# Patient Record
Sex: Male | Born: 2000 | Race: White | Hispanic: No | Marital: Single | Smoking: Current some day smoker
Health system: Southern US, Community
[De-identification: ages and names within clinical notes are randomized; demographics above are authoritative.]

## PROBLEM LIST (undated history)

## (undated) DIAGNOSIS — J302 Other seasonal allergic rhinitis: Secondary | ICD-10-CM

## (undated) DIAGNOSIS — J45909 Unspecified asthma, uncomplicated: Secondary | ICD-10-CM

## (undated) DIAGNOSIS — B948 Sequelae of other specified infectious and parasitic diseases: Secondary | ICD-10-CM

## (undated) DIAGNOSIS — D8989 Other specified disorders involving the immune mechanism, not elsewhere classified: Secondary | ICD-10-CM

---
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
## 2018-07-02 ENCOUNTER — Other Ambulatory Visit: Payer: Self-pay | Admitting: Gastroenterology

## 2018-07-02 DIAGNOSIS — R1013 Epigastric pain: Secondary | ICD-10-CM

## 2018-07-04 ENCOUNTER — Other Ambulatory Visit
Admission: RE | Admit: 2018-07-04 | Discharge: 2018-07-04 | Disposition: A | Discharge status: Home / Self Care | Payer: BLUE CROSS/BLUE SHIELD | Source: Ambulatory Visit | Attending: Gastroenterology | Admitting: Gastroenterology

## 2018-07-04 ENCOUNTER — Ambulatory Visit
Admission: RE | Admit: 2018-07-04 | Discharge: 2018-07-04 | Disposition: A | Discharge status: Home / Self Care | Payer: BLUE CROSS/BLUE SHIELD | Source: Ambulatory Visit | Attending: Gastroenterology | Admitting: Gastroenterology

## 2018-07-04 DIAGNOSIS — R1013 Epigastric pain: Secondary | ICD-10-CM

## 2018-07-04 LAB — CBC WITH DIFFERENTIAL/PLATELET
Abs Immature Granulocytes: 0.01 10*3/uL (ref 0.00–0.07)
BASOS PCT: 1 %
Basophils Absolute: 0 10*3/uL (ref 0.0–0.1)
EOS ABS: 0.1 10*3/uL (ref 0.0–0.5)
Eosinophils Relative: 3 %
HCT: 38.5 % — ABNORMAL LOW (ref 39.0–52.0)
Hemoglobin: 13.5 g/dL (ref 13.0–17.0)
Immature Granulocytes: 0 %
Lymphocytes Relative: 24 %
Lymphs Abs: 1 10*3/uL (ref 0.7–4.0)
MCH: 31.2 pg (ref 26.0–34.0)
MCHC: 35.1 g/dL (ref 30.0–36.0)
MCV: 88.9 fL (ref 80.0–100.0)
MONO ABS: 0.4 10*3/uL (ref 0.1–1.0)
MONOS PCT: 10 %
NEUTROS PCT: 62 %
Neutro Abs: 2.8 10*3/uL (ref 1.7–7.7)
PLATELETS: 197 10*3/uL (ref 150–400)
RBC: 4.33 MIL/uL (ref 4.22–5.81)
RDW: 12.6 % (ref 11.5–15.5)
WBC: 4.4 10*3/uL (ref 4.0–10.5)
nRBC: 0 % (ref 0.0–0.2)

## 2018-07-04 LAB — BASIC METABOLIC PANEL
ANION GAP: 7 (ref 5–15)
BUN: 12 mg/dL (ref 6–20)
CALCIUM: 9.3 mg/dL (ref 8.9–10.3)
CHLORIDE: 106 mmol/L (ref 98–111)
CO2: 28 mmol/L (ref 22–32)
CREATININE: 0.78 mg/dL (ref 0.61–1.24)
GFR calc non Af Amer: >60 mL/min (ref >60)
Glucose, Bld: 97 mg/dL (ref 70–99)
Potassium: 4.1 mmol/L (ref 3.5–5.1)
SODIUM: 141 mmol/L (ref 135–145)

## 2018-07-04 LAB — PROTIME-INR
INR: 0.99
PROTHROMBIN TIME: 13 s (ref 11.4–15.2)

## 2019-03-15 IMAGING — CR DG CHEST 2V
1 series · 2 of 2 positions shown · non-contrast
Comparison: None available

CLINICAL DATA: Left chest pain

EXAM:
CHEST - 2 VIEW

[Series 1: dg chest 2 view · 0.14mm/px · 2 of 2 slices shown]
[im 1/2]
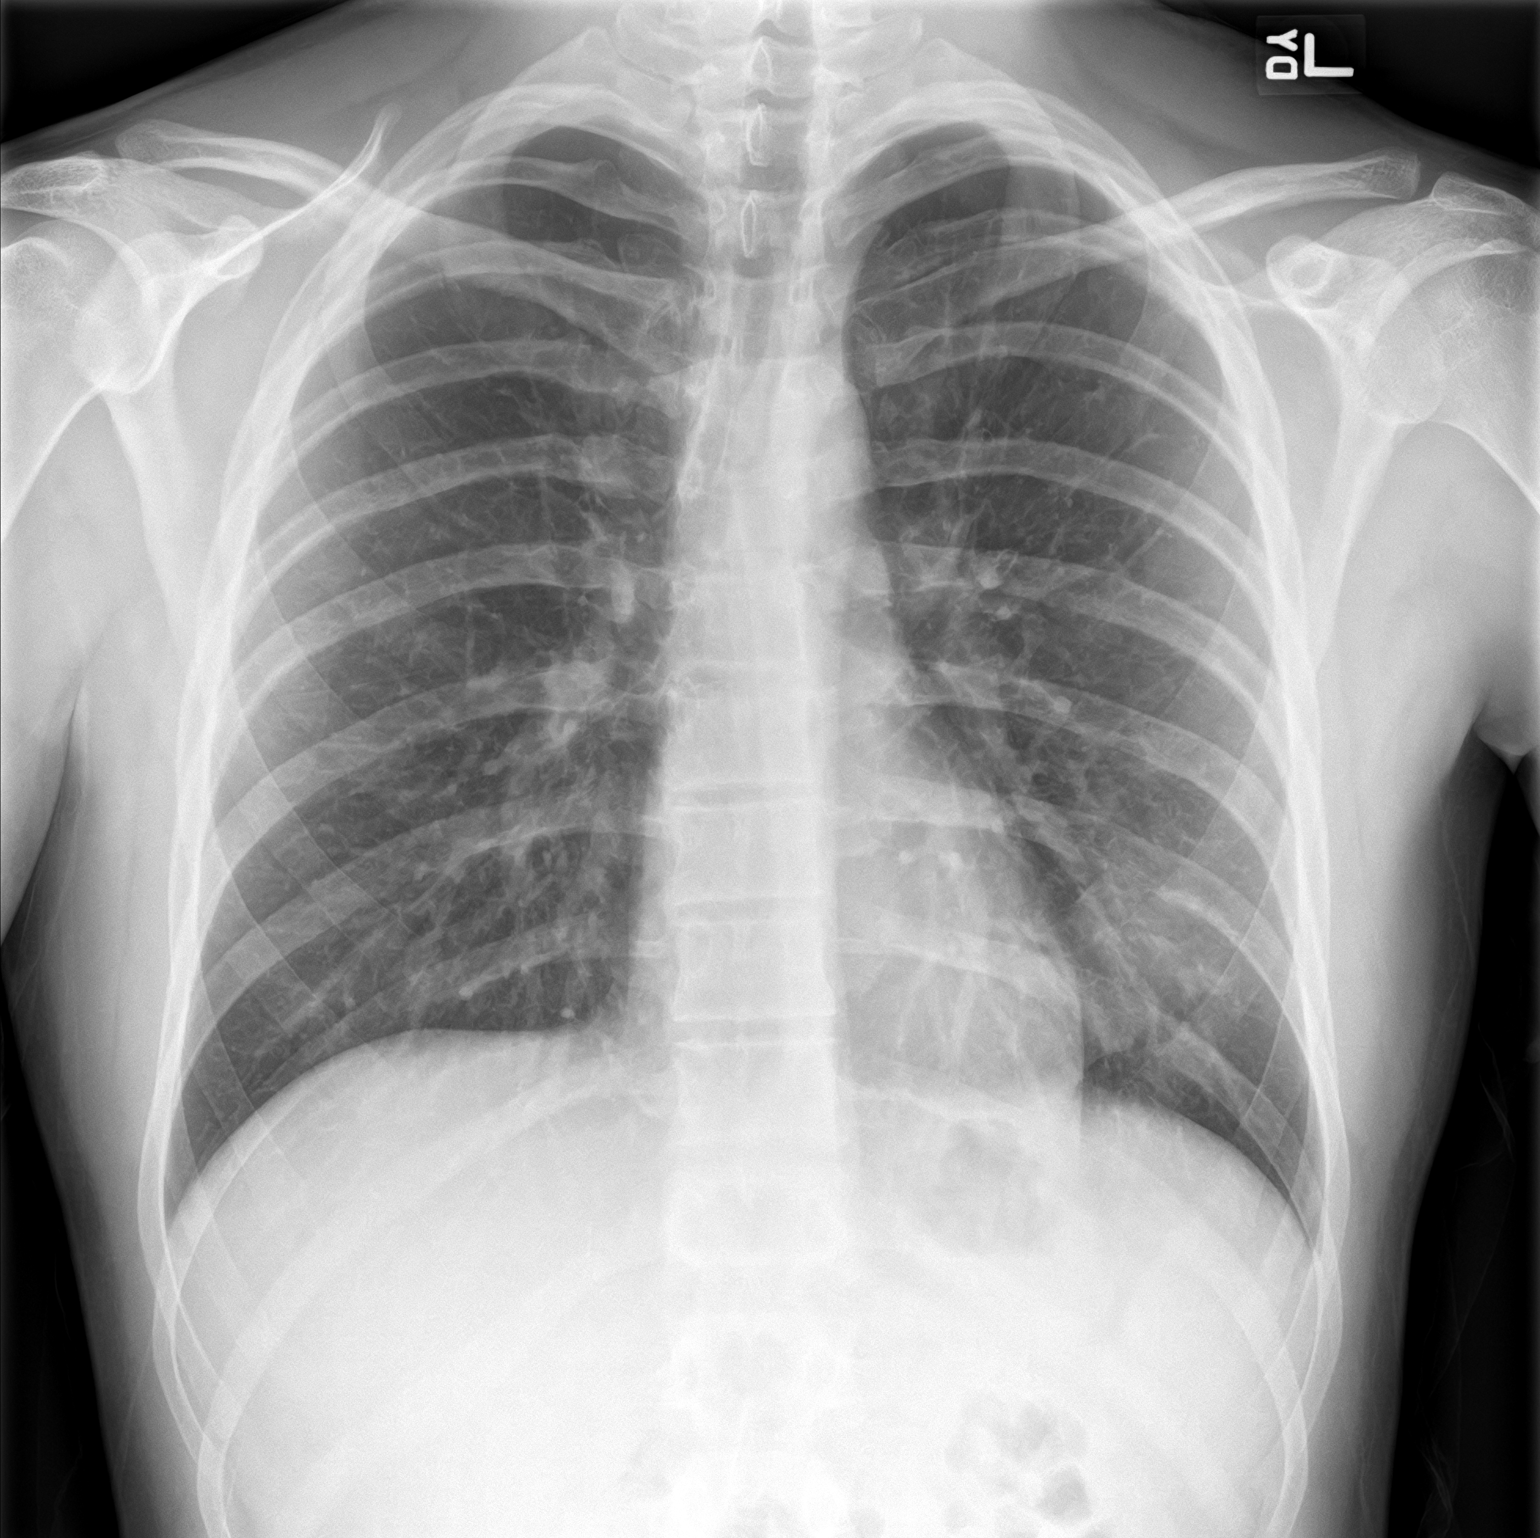
[im 2/2]
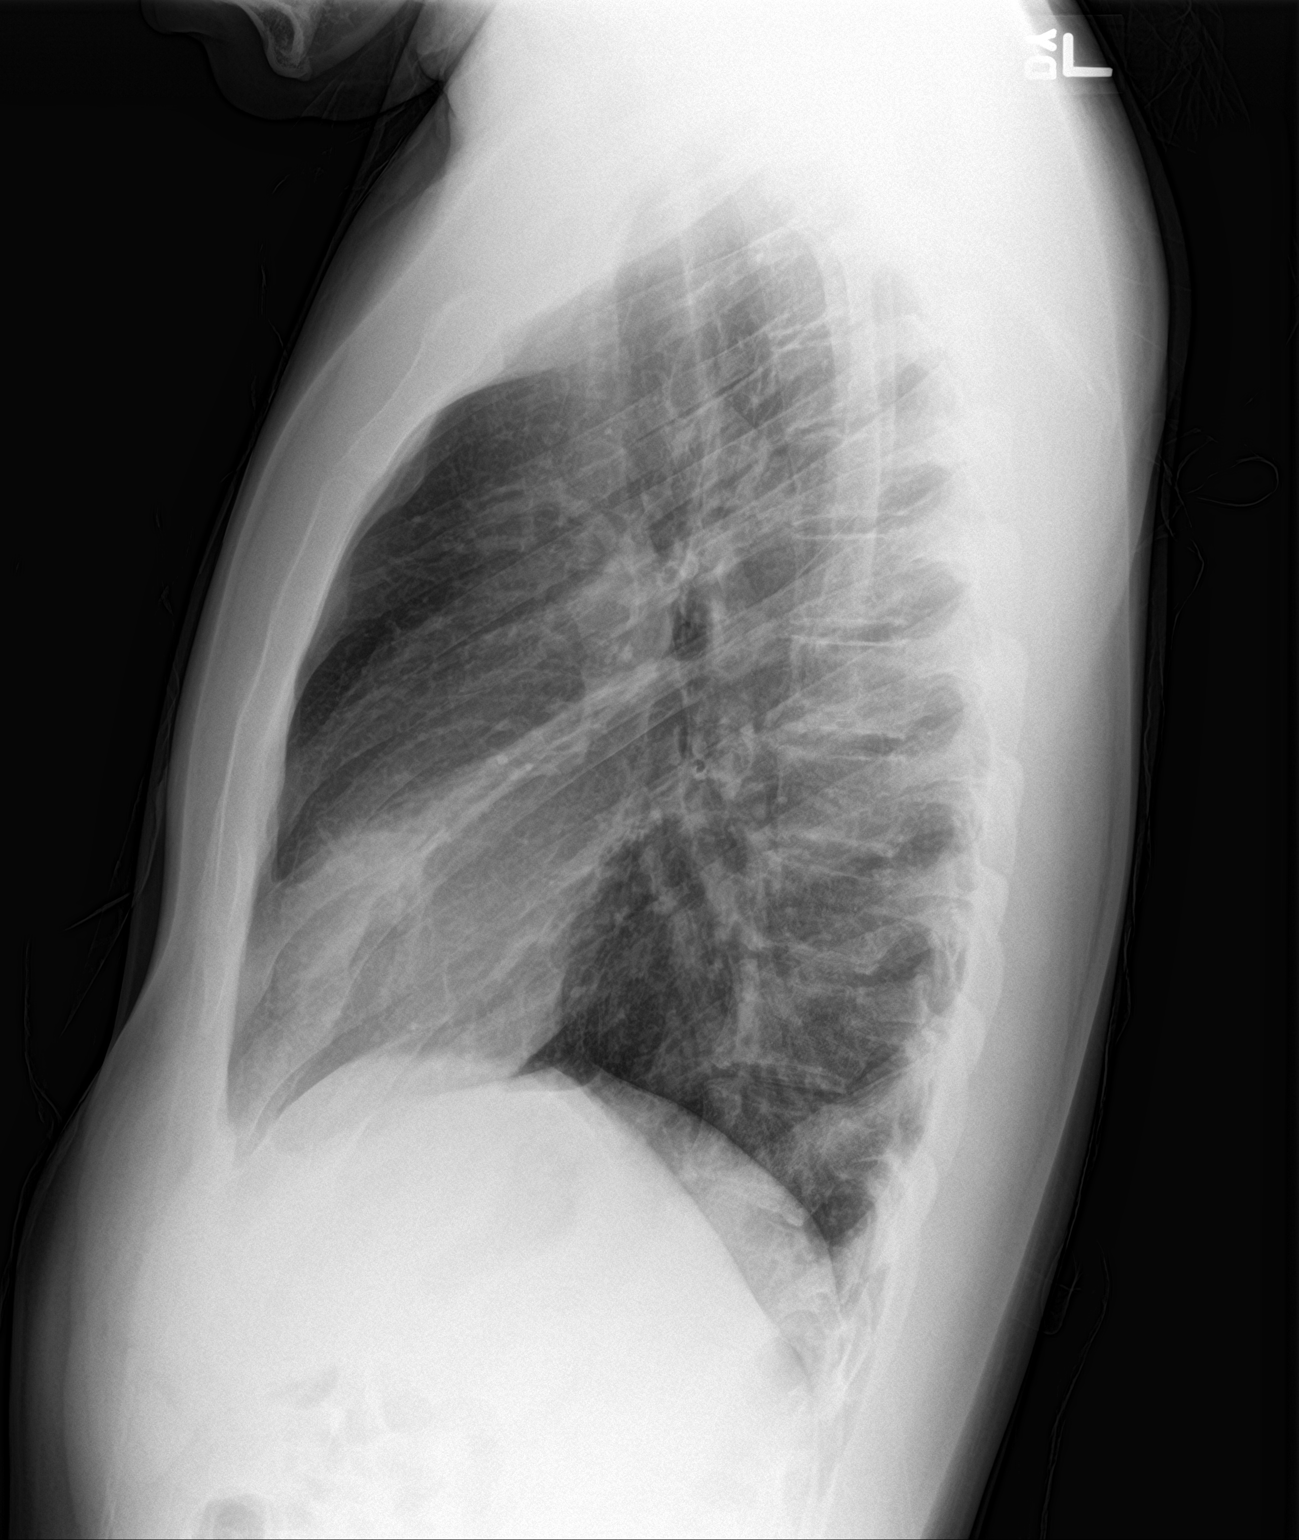

[2 of 2 positions shown; findings below may reference images not displayed]

FINDINGS: Ill-defined lingula opacity, best appreciated on the lateral view
suspicious for lingula pneumonia. Right lung remains clear. Normal
heart size and vascularity. Negative for edema, effusion or
pneumothorax. Trachea is midline. No osseous abnormality.
IMPRESSION: Suspect mild subtle lingula pneumonia.

## 2019-12-02 IMAGING — RF DG UGI W/ HIGH DENSITY W/KUB
11 of 13 series · 14 of 24 positions shown · non-contrast
Comparison: None.

CLINICAL DATA: Nausea and vomiting

EXAM:
UPPER GI SERIES WITH KUB
TECHNIQUE: After obtaining a scout radiograph a routine upper GI series was
performed using high density barium
FLUOROSCOPY TIME:  Fluoroscopy Time:  1 minutes 42 seconds
Radiation Exposure Index (if provided by the fluoroscopic device):
66.6 mGy
Number of Acquired Spot Images: 16, multiple cine fluoroscopic runs

[Series 1: t abdomen supine · 0.15mm/px · 1 of 1 slices shown]
[im 1/1]
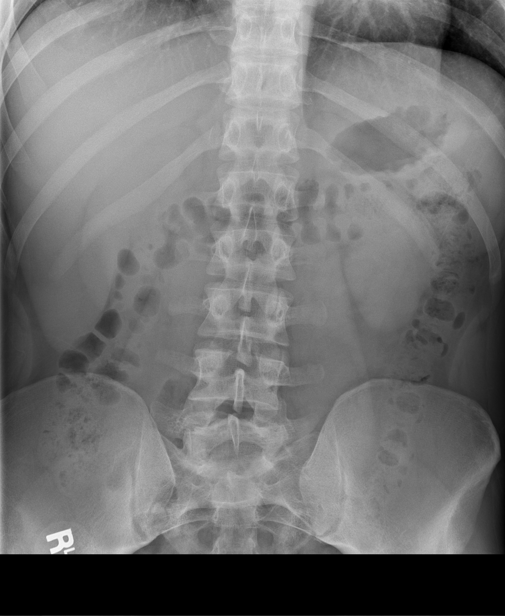

[Series 2: cp_standard · 0.17mm/px · 1 of 89 frames shown (1 of 5)]
[frame 45/89]
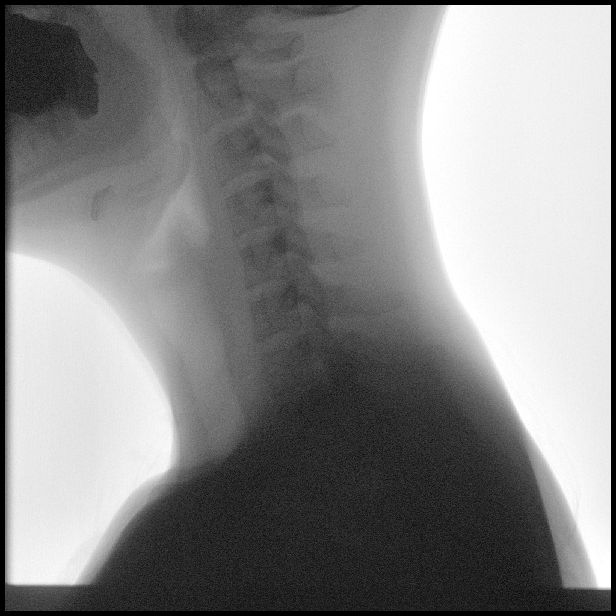

[Series 3: cp_standard · 0.17mm/px · 1 of 98 frames shown (2 of 5)]
[frame 15/98]
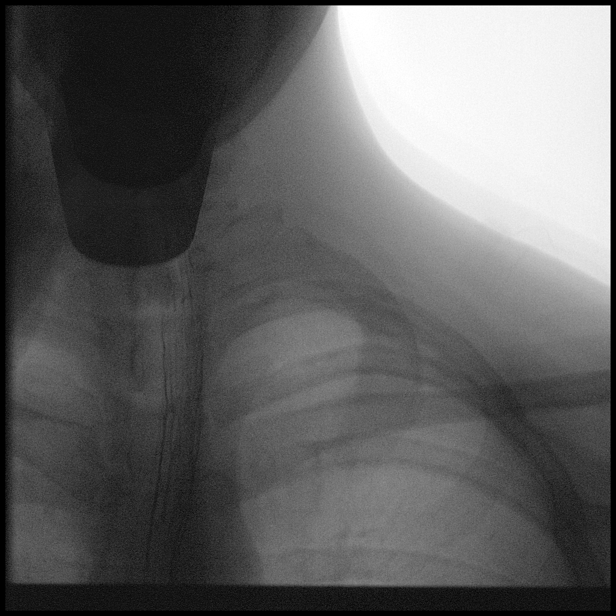

[Series 4: fluoro_barium 2fps_bw · 0.17mm/px · 2 of 2 frames shown (1 of 5)]
[frame 1/2]
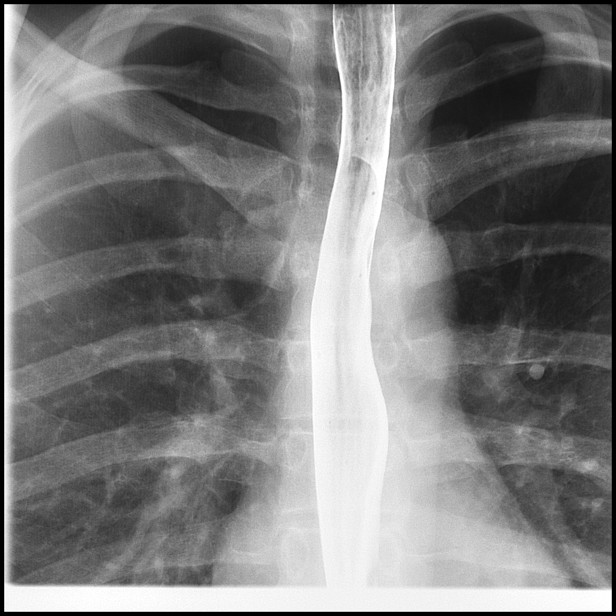
[frame 2/2]
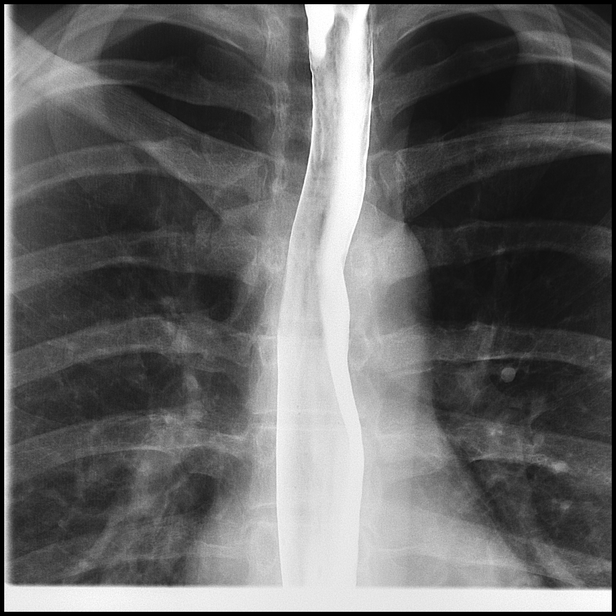

[Series 5: fluoro_barium 2fps_bw · 0.17mm/px · 1 of 3 frames shown (2 of 5)]
[frame 3/3]
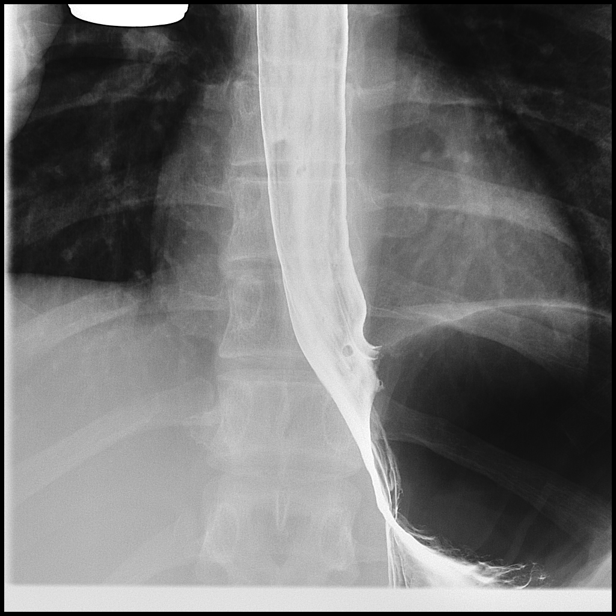

[Series 8: cp_standard · 0.18mm/px · 2 of 43 frames shown (3 of 5)]
[frame 5/43]
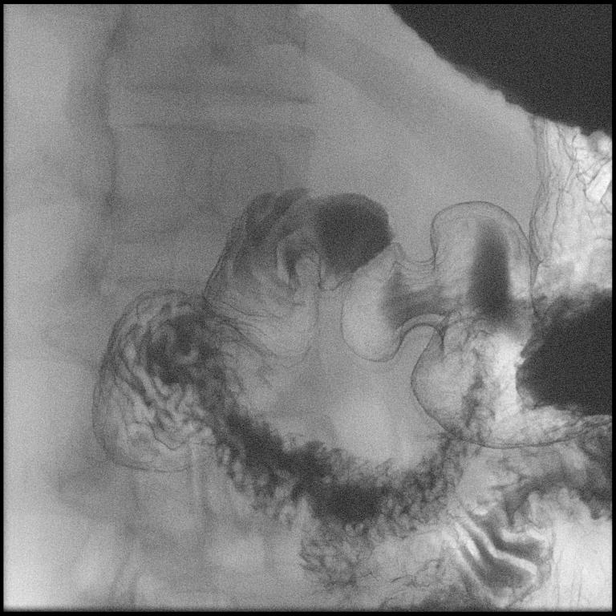
[frame 22/43]
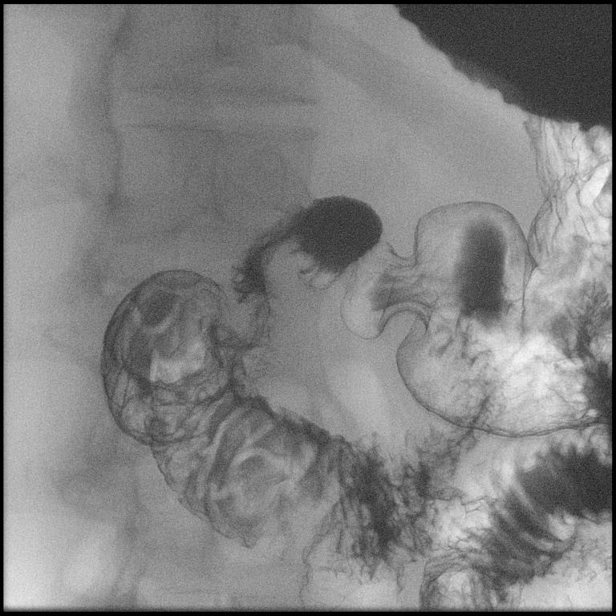

[Series 10: fluoro_barium 2fps_bw · 0.18mm/px · 1 of 1 slices shown (3 of 5)]
[im 1/1]
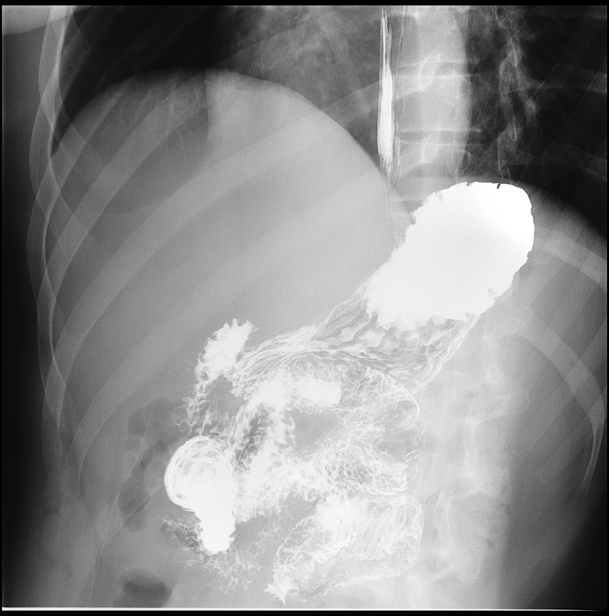

[Series 11: cp_standard · 0.18mm/px · 1 of 31 frames shown (4 of 5)]
[frame 26/31]
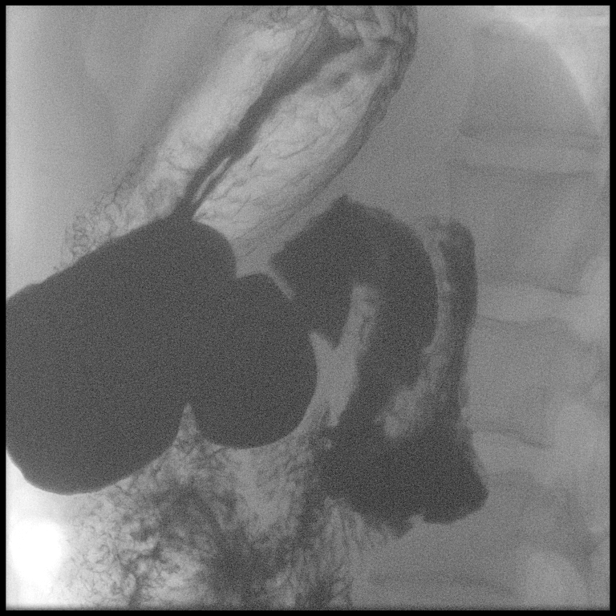

[Series 12: fluoro_barium 2fps_bw · 0.18mm/px · 2 of 5 frames shown (4 of 5)]
[frame 3/5]
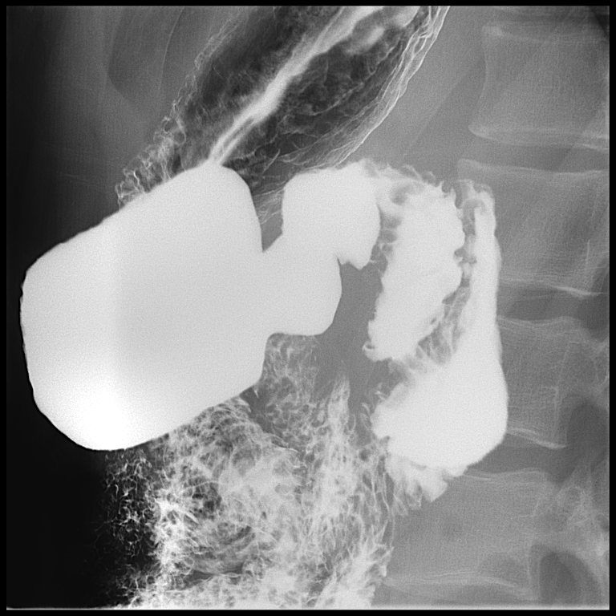
[frame 5/5]
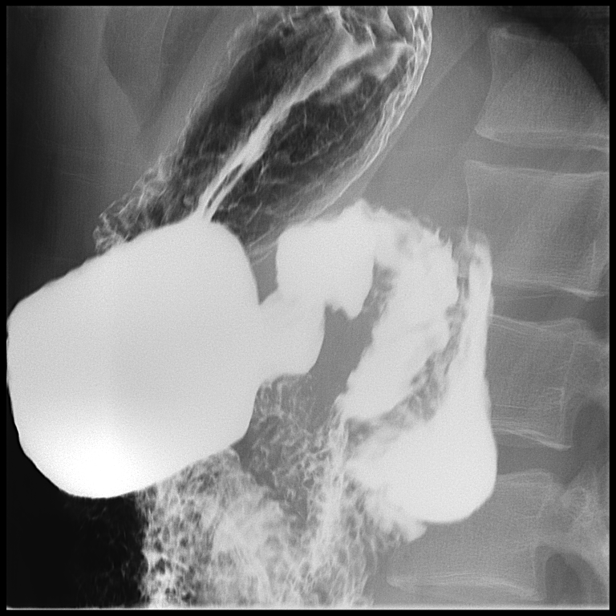

[Series 13: cp_standard · 0.18mm/px · 1 of 22 frames shown (5 of 5)]
[frame 16/22]
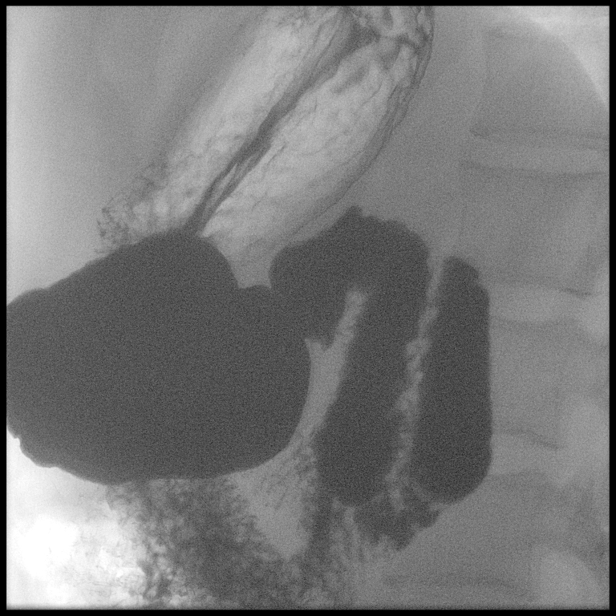

[Series 15: fluoro_barium 2fps_bw · 0.18mm/px · 1 of 1 slices shown (5 of 5)]
[im 1/1]
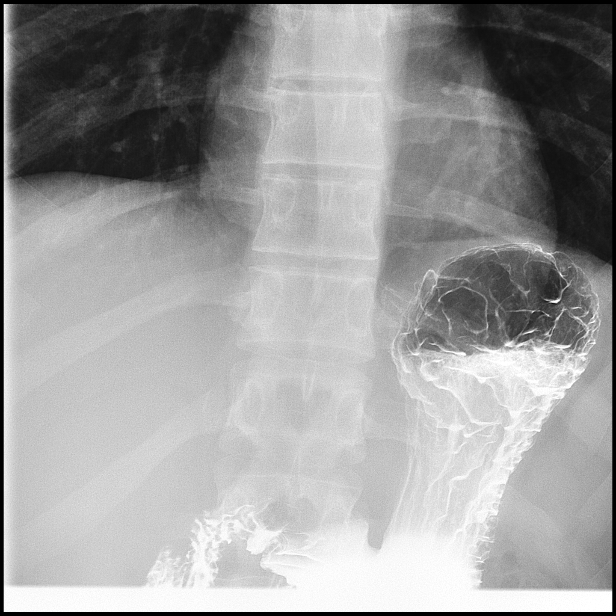

[14 of 24 positions shown; findings below may reference images not displayed]

FINDINGS: Initial plain film scout of the abdomen demonstrates a
nonobstructive bowel gas pattern. No abnormal mass or abnormal
calcifications are seen. No bony abnormality is noted.

Swallowing mechanism and esophageal transit time are within normal
limits. Esophagus distends well. No mucosal abnormality is seen. No
reflux is noted. Barium impregnated tablet passed without
difficulty.

The stomach distends well but demonstrates some persistent
thickening of the mucosal folds consistent with gastritis. No
definitive ulcer crater is seen. Free flow contrast into the
proximal duodenum is noted. Duodenal bulb and duodenal sweep are
within normal limits.
IMPRESSION: Changes of gastritis.  No ulcer crater is seen.

No other focal abnormality is noted.

## 2020-05-26 ENCOUNTER — Other Ambulatory Visit (HOSPITAL_COMMUNITY): Payer: Self-pay | Admitting: Acute Care

## 2020-05-26 ENCOUNTER — Other Ambulatory Visit: Payer: Self-pay | Admitting: Acute Care

## 2020-05-26 DIAGNOSIS — G44309 Post-traumatic headache, unspecified, not intractable: Secondary | ICD-10-CM

## 2020-06-15 ENCOUNTER — Ambulatory Visit: Payer: BC Managed Care – PPO

## 2020-07-02 ENCOUNTER — Ambulatory Visit: Payer: BC Managed Care – PPO
